# Patient Record
Sex: Male | Born: 1965 | Race: White | Hispanic: No | Marital: Married | Smoking: Never smoker
Health system: Southern US, Community
[De-identification: ages and names within clinical notes are randomized; demographics above are authoritative.]

## PROBLEM LIST (undated history)

## (undated) DIAGNOSIS — K529 Noninfective gastroenteritis and colitis, unspecified: Secondary | ICD-10-CM

## (undated) DIAGNOSIS — N200 Calculus of kidney: Secondary | ICD-10-CM

## (undated) HISTORY — DX: Calculus of kidney: N20.0

## (undated) HISTORY — PX: HERNIA REPAIR: SHX51

## (undated) HISTORY — DX: Noninfective gastroenteritis and colitis, unspecified: K52.9

---

## 2003-12-19 ENCOUNTER — Emergency Department (HOSPITAL_COMMUNITY): Admission: EM | Admit: 2003-12-19 | Discharge: 2003-12-19 | Payer: Self-pay | Admitting: Emergency Medicine

## 2010-09-20 ENCOUNTER — Emergency Department (INDEPENDENT_AMBULATORY_CARE_PROVIDER_SITE_OTHER): Payer: 59

## 2010-09-20 ENCOUNTER — Emergency Department (HOSPITAL_BASED_OUTPATIENT_CLINIC_OR_DEPARTMENT_OTHER)
Admission: EM | Admit: 2010-09-20 | Discharge: 2010-09-20 | Disposition: A | Discharge status: Home / Self Care | Payer: 59 | Attending: Emergency Medicine | Admitting: Emergency Medicine

## 2010-09-20 ENCOUNTER — Other Ambulatory Visit: Payer: Self-pay

## 2010-09-20 DIAGNOSIS — R079 Chest pain, unspecified: Secondary | ICD-10-CM

## 2010-09-20 DIAGNOSIS — R0602 Shortness of breath: Secondary | ICD-10-CM | POA: Insufficient documentation

## 2010-09-20 LAB — CBC
Hemoglobin: 16.3 g/dL (ref 13.0–17.0)
MCH: 30.6 pg (ref 26.0–34.0)
MCHC: 36.4 g/dL — ABNORMAL HIGH (ref 30.0–36.0)
Platelets: 185 10*3/uL (ref 150–400)
RBC: 5.33 MIL/uL (ref 4.22–5.81)
RDW: 11.9 % (ref 11.5–15.5)
WBC: 6.1 10*3/uL (ref 4.0–10.5)

## 2010-09-20 LAB — BASIC METABOLIC PANEL
Calcium: 10.2 mg/dL (ref 8.4–10.5)
Chloride: 103 mEq/L (ref 96–112)
GFR calc Af Amer: >60 mL/min (ref >60)
GFR calc non Af Amer: >60 mL/min (ref >60)
Glucose, Bld: 107 mg/dL — ABNORMAL HIGH (ref 70–99)
Potassium: 3.5 mEq/L (ref 3.5–5.1)
Sodium: 141 mEq/L (ref 135–145)

## 2010-09-20 LAB — HEPATIC FUNCTION PANEL
ALT: 27 U/L (ref 0–53)
AST: 24 U/L (ref 0–37)
Albumin: 4.6 g/dL (ref 3.5–5.2)
Alkaline Phosphatase: 73 U/L (ref 39–117)
Bilirubin, Direct: 0.1 mg/dL (ref 0.0–0.3)
Indirect Bilirubin: 0.4 mg/dL (ref 0.3–0.9)
Total Bilirubin: 0.5 mg/dL (ref 0.3–1.2)

## 2010-09-20 LAB — CARDIAC PANEL(CRET KIN+CKTOT+MB+TROPI)
CK, MB: 1.8 ng/mL (ref 0.3–4.0)
Relative Index: INVALID (ref 0.0–2.5)
Relative Index: INVALID (ref 0.0–2.5)
Total CK: 89 U/L (ref 7–232)
Total CK: 67 U/L (ref 7–232)
Troponin I: <0.3 ng/mL (ref <0.30)
Troponin I: <0.3 ng/mL (ref <0.30)

## 2010-09-20 LAB — LIPASE, BLOOD: Lipase: 71 U/L — ABNORMAL HIGH (ref 11–59)

## 2010-09-20 MED ORDER — ACETAMINOPHEN 325 MG PO TABS
650.0000 mg | ORAL_TABLET | Freq: Once | ORAL | Status: AC
  Administered 2010-09-20: 650 mg via ORAL

## 2010-09-20 MED ORDER — SODIUM CHLORIDE 0.9 % IV BOLUS (SEPSIS)
1000.0000 mL | Freq: Once | INTRAVENOUS | Status: AC
  Administered 2010-09-20: 1000 mL via INTRAVENOUS

## 2010-09-20 MED ORDER — ACETAMINOPHEN 325 MG PO TABS
ORAL_TABLET | ORAL | Status: AC
  Filled 2010-09-20: qty 2

## 2010-09-20 MED ORDER — ASPIRIN 81 MG PO CHEW
162.0000 mg | CHEWABLE_TABLET | Freq: Once | ORAL | Status: AC
  Administered 2010-09-20: 162 mg via ORAL
  Filled 2010-09-20: qty 2

## 2010-09-20 MED ORDER — NITROGLYCERIN 0.4 MG SL SUBL
0.4000 mg | SUBLINGUAL_TABLET | SUBLINGUAL | Status: DC | PRN
  Administered 2010-09-20 (×2): 0.4 mg via SUBLINGUAL
  Filled 2010-09-20: qty 25

## 2010-09-20 NOTE — ED Notes (Signed)
Pt states that he had chest pain about a month ago and took aspirin for it and it went away.  Pt states that this morning chest pain came back about  3hours ago.  Pt took two 81 mg aspirin at home pta.  Neg cardiac hx.  ECG NSR

## 2010-09-20 NOTE — ED Notes (Signed)
Family at bedside. 

## 2010-09-20 NOTE — ED Notes (Signed)
Report received from Maryruth Hancock, RN care assumed.  Dr Bernette Mayers at bedside.  NSR per cardiac monitor.

## 2010-09-20 NOTE — ED Notes (Signed)
Pt is pale, diaphoretic and c/o not feeling well.  BP 876/50 SB with a rate of 44-46.  Dr. Bernette Mayers informed and at bedside.  IVF open to bolus.

## 2010-09-20 NOTE — ED Notes (Signed)
Color improving, skin warm and dry.  IV site #2 initiated.  Pt reports feeling better.  VS improving.

## 2010-09-20 NOTE — ED Provider Notes (Signed)
History     CSN: [id number] Arrival date & time: 09/20/2010  6:26 AM  Chief Complaint  Patient presents with  . Chest Pain  . Shortness of Breath   HPI Pt reports onset of severe sharp midsternal chest pain about 2am. Continued all night, improved some this morning. Associated with fast breathing, no radiation into jaw or arm, no diaphoresis, mild nausea. He has had similar episodes in the past that have resolved after with calming. He states this episode has lasted longer. He still has 5/10 pain now. PERC neg, no PE or CAD risk factors.   History reviewed. No pertinent past medical history.  Past Surgical History  Procedure Date  . Hernia repair     History reviewed. No pertinent family history.  History  Substance Use Topics  . Smoking status: Never Smoker   . Smokeless tobacco: Never Used  . Alcohol Use: 0.0 oz/week    1-2 Cans of beer per week      Review of Systems All other systems reviewed and are negative except as noted in HPI.   Physical Exam  BP 125/86  Pulse 62  Temp(Src) 98.8 F (37.1 C) (Oral)  Resp 23  SpO2 100%  Physical Exam  Nursing note and vitals reviewed. Constitutional: He is oriented to person, place, and time. He appears well-developed and well-nourished.  HENT:  Head: Normocephalic and atraumatic.  Eyes: EOM are normal. Pupils are equal, round, and reactive to light.  Neck: Normal range of motion. Neck supple.  Cardiovascular: Normal rate, normal heart sounds and intact distal pulses.   Pulmonary/Chest: Effort normal and breath sounds normal.  Abdominal: Bowel sounds are normal. He exhibits no distension. There is tenderness (mild epigastric tenderness without guarding).  Musculoskeletal: Normal range of motion. He exhibits no edema and no tenderness.  Neurological: He is alert and oriented to person, place, and time. No cranial nerve deficit.  Skin: Skin is warm and dry. No rash noted.  Psychiatric: He has a normal mood and  affect.    ED Course  Procedures  MDM    Date: 09/20/2010  Rate: 65  Rhythm: normal sinus rhythm  QRS Axis: normal  Intervals: normal  ST/T Wave abnormalities: normal  Conduction Disutrbances:none  Narrative Interpretation:   Old EKG Reviewed: none available   8:18 AM Pt had a brief episode of hypotension and bradycardia following NTG administration. Resolved with IVF. Feeling better now. Awaiting second set of cardiac markers. Pt is low risk for CAD.    10:47 AM PT resting comfortably. Labs and imaging unremarkable except for mildly elevated lipase of unclear significance. His epigastric tenderness is gone. He is a low risk patient for CAD. Had a long discussion with the patient and his wife regarding further evaluation of his pain. Offered ED observation protocol vs outpatient stress test and he would prefer to go home. He will followup with cardiology for outpatient evaluation. Advised to take ASA daily and return immediately for any return of CP, SOB or for any other concerns.   Kalib Bhagat B. Bernette Mayers, MD 09/20/10 1048

## 2010-09-27 ENCOUNTER — Encounter: Payer: Self-pay | Admitting: Internal Medicine

## 2010-10-21 ENCOUNTER — Encounter: Payer: Self-pay | Admitting: Internal Medicine

## 2010-10-22 ENCOUNTER — Encounter: Payer: Self-pay | Admitting: Internal Medicine

## 2010-10-22 ENCOUNTER — Ambulatory Visit (INDEPENDENT_AMBULATORY_CARE_PROVIDER_SITE_OTHER): Payer: Managed Care, Other (non HMO) | Admitting: Internal Medicine

## 2010-10-22 DIAGNOSIS — R079 Chest pain, unspecified: Secondary | ICD-10-CM

## 2010-10-22 DIAGNOSIS — R1011 Right upper quadrant pain: Secondary | ICD-10-CM

## 2010-10-22 MED ORDER — OMEPRAZOLE 20 MG PO CPDR: 20.0000 mg | DELAYED_RELEASE_CAPSULE | Freq: Every day | ORAL | Status: DC

## 2010-10-22 NOTE — Patient Instructions (Signed)
Abdominal ultrasound to be scheduled. 

## 2010-10-22 NOTE — Progress Notes (Signed)
HPIPt reports onset of severe sharp midsternal chest pain about 2am. Continued all night, improved some this morning. Associated with fast breathing, no radiation into jaw or arm, no diaphoresis, mild nausea. He has had similar episodes in the past that have resolved after with calming. He states this episode has lasted longer.  Patient observed, enzymes negative.  Sent out for outpatient evaluation Since he left the patient has had 2 episodes.  Sharp stabbing sensation.  Tightness.  No real activity.   Has walked up steep incline, played golf couple times. Can walk dogs 30 min No problems First speel April  Second in June Father and paternal GF with CAD.     Allergies  Allergen Reactions  . Penicillins Rash    Current Outpatient Prescriptions  Medication Sig Dispense Refill  . aspirin 81 MG tablet Take 162 mg by mouth once.          Past Medical History  Diagnosis Date  . Chest pain     Past Surgical History  Procedure Date  . Hernia repair   . Hernia repair     Family History  Problem Relation Age of Onset  . Coronary artery disease Father 81    CABG    History   Social History  . Marital Status: Married    Spouse Name: N/A    Number of Children: 1  . Years of Education: N/A   Occupational History  .  [employer]   Social History Main Topics  . Smoking status: Never Smoker   . Smokeless tobacco: Never Used  . Alcohol Use: 0.0 oz/week    1-2 Cans of beer per week  . Drug Use: No  . Sexually Active: Yes    Birth Control/ Protection: Condom   Other Topics Concern  . Not on file   Social History Narrative  . No narrative on file    Review of Systems:  All systems reviewed.  They are negative to the above problem except as previously stated.  Vital Signs: BP 114/76  Pulse 79  Ht 5\' 10"  (1.778 m)  Wt 205 lb (92.987 kg)  BMI 29.41 kg/m2  Physical Exam  Patient is in NAD HEENT:  Normocephalic, atraumatic. EOMI, PERRLA.  Neck: JVP is  normal. No thyromegaly. No bruits.  Lungs: clear to auscultation. No rales no wheezes.  Heart: Regular rate and rhythm. Normal S1, S2. No S3.   No significant murmurs. PMI not displaced.  Abdomen:  Supple, nontender. Normal bowel sounds. No masses. No hepatomegaly.  Extremities:   Good distal pulses throughout. No lower extremity edema.  Musculoskeletal :moving all extremities.  Neuro:   alert and oriented x3.  CN II-XII grossly intact. EKG:  Sinus rhythm.  79 bpm.   Assessment and Plan:

## 2010-10-26 DIAGNOSIS — R079 Chest pain, unspecified: Secondary | ICD-10-CM | POA: Insufficient documentation

## 2010-10-26 NOTE — Assessment & Plan Note (Signed)
Pain is very atypical for coronary ischemia unless it is possibly due to coronary spasm.  No mention on ER visit that EKG was abnormal. Of note in the recent ER visit his lipase was elevated.  I wonder if symptoms are triggered by a gallstone.  Again, they are too erratic and not associated with activity to suggest demand ischemia. I would recomm an abdomenal USN to define GB and pancreas.  No change in meds for now.

## 2010-10-28 ENCOUNTER — Ambulatory Visit (HOSPITAL_COMMUNITY)
Admission: RE | Admit: 2010-10-28 | Discharge: 2010-10-28 | Disposition: A | Discharge status: Home / Self Care | Payer: 59 | Source: Ambulatory Visit | Attending: Internal Medicine | Admitting: Internal Medicine

## 2010-10-28 ENCOUNTER — Telehealth: Payer: Self-pay | Admitting: Internal Medicine

## 2010-10-28 DIAGNOSIS — R079 Chest pain, unspecified: Secondary | ICD-10-CM

## 2010-10-28 DIAGNOSIS — K8020 Calculus of gallbladder without cholecystitis without obstruction: Secondary | ICD-10-CM | POA: Insufficient documentation

## 2010-10-28 DIAGNOSIS — R1011 Right upper quadrant pain: Secondary | ICD-10-CM | POA: Insufficient documentation

## 2010-10-28 NOTE — Telephone Encounter (Signed)
Call back from Dr. Tenny Craw. She states she is not convinced the patient is not having GI issues, but since his symptoms are atypical, she would like to have him see GI tomorrow since his episode of pain yesterday lasted an hour. We will also set him up for a cardiac CT. I have spoken with GI and the patient has an appointment tomorrow at 3:30pm with Dr. Russella Dar. I will order the cardiac CT and notify the patient's wife.

## 2010-10-28 NOTE — Telephone Encounter (Signed)
I spoke with the patient's wife. She is aware of the appointment with Dr. Russella Dar for tomorrow at 3:30pm. I have advised them to be there by 3:00pm and to call by 5:00pm today if he cannot make the appointment tomorrow. Jodie verbalizes understanding. I explained I will order the cardiac CT and then our PCC's will call back to set this up. I also explained we will hold off on the PA for omeprazole until the patient sees Dr. Russella Dar tomorrow.

## 2010-10-28 NOTE — Telephone Encounter (Signed)
Pt still has not got his omeprazole 20mg  qd at [facility] on [address] because they need autherizations and pt would like results of testing done

## 2010-10-28 NOTE — Telephone Encounter (Signed)
I spoke with the patient's wife. She states that the patient reports another episode of severe chest pain yesterday that occurred with sitting and lasted about an hour. He denies any radiation of symptoms. He has a history of hernia repair. He had a negative abdominal ultrasound that was just done. He was given a prescription for omeprazole at his office visit, but this has not been filled due to the need for PA. He did start OTC famotidine 20mg  daily and has been taking this on a daily basis since his office visit. He continues with his ASA. I explained to the patient's wife that I would call Dr. Tenny Craw to discuss this with her. 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 She states she can email him, but can't guarantee a timely call back. I will speak with Dr. Tenny Craw and call her back. Sherri Rad, RN, BSN   I reviewed the patient's symptoms with Dr. Tenny Craw. She is undecided as to what the best option would be for the patient (possible cath vs Cardiac CT). She states she will call the patient's wife and then let me know what needs to be done. Sherri Rad, RN, BSN

## 2010-10-29 ENCOUNTER — Ambulatory Visit (INDEPENDENT_AMBULATORY_CARE_PROVIDER_SITE_OTHER): Payer: 59 | Admitting: Gastroenterology

## 2010-10-29 ENCOUNTER — Telehealth: Payer: Self-pay | Admitting: Internal Medicine

## 2010-10-29 ENCOUNTER — Encounter: Payer: Self-pay | Admitting: Gastroenterology

## 2010-10-29 ENCOUNTER — Other Ambulatory Visit (INDEPENDENT_AMBULATORY_CARE_PROVIDER_SITE_OTHER): Payer: 59

## 2010-10-29 DIAGNOSIS — R079 Chest pain, unspecified: Secondary | ICD-10-CM

## 2010-10-29 DIAGNOSIS — K8020 Calculus of gallbladder without cholecystitis without obstruction: Secondary | ICD-10-CM

## 2010-10-29 NOTE — Telephone Encounter (Signed)
Pt talked to [facility] on Thurs about a pres.  Dr.Stark wants him to take it.  The prescription needs to be authorized.  Prilosec generic .  Check insurance card for number to call.  Call wife back when this is done.  Call into [facility] on [address].

## 2010-10-29 NOTE — Progress Notes (Signed)
History of Present Illness: This is a 45 year old male here today with his wife. He has had infrequent episodes of heartburn brought on by certain dietary stressors over the years. He has not noted any significant heartburn symptoms in the past few months. He has had recurrent episodes of lower chest pain that radiated across his anterior chest for several weeks. The symptoms have generally occurred when he is resting. They have not been associated with meals. The chest pain tends to last approximately one hour. One episode was associated with shortness of breath. He was evaluated in the emergency room and all blood work was unremarkable except for minimally elevated lipase at 71, which is likely nonspecific. Abdominal exam performed yesterday showed cholelithiasis without other abnormalities. Denies weight loss, abdominal pain, constipation, diarrhea, change in stool caliber, melena, hematochezia, nausea, vomiting, dysphagia.  Review of Systems: Pertinent positive and negative review of systems were noted in the above HPI section. All other review of systems were otherwise negative.  Current Medications, Allergies, Past Medical History, Past Surgical History, Family History and Social History were reviewed in [employer] record.  Physical Exam: General: Well developed , well nourished, no acute distress Head: Normocephalic and atraumatic Eyes:  sclerae anicteric, EOMI Ears: Normal auditory acuity Mouth: No deformity or lesions Neck: Supple, no masses or thyromegaly Lungs: Clear throughout to auscultation, no chest wall tenderness Heart: Regular rate and rhythm; no murmurs, rubs or bruits Abdomen: Soft, non tender and non distended. No masses, hepatosplenomegaly or hernias noted. Normal Bowel sounds Musculoskeletal: Symmetrical with no gross deformities  Skin: No lesions on visible extremities Pulses:  Normal pulses noted Extremities: No clubbing, cyanosis, edema or  deformities noted Neurological: Alert oriented x 4, grossly nonfocal Cervical Nodes:  No significant cervical adenopathy Inguinal Nodes: No significant inguinal adenopathy Psychological:  Alert and cooperative. Normal mood and affect  Assessment and Recommendations:  1. Chest pain. R/O GERD, biliary and non GI causes. He has occasional reflux symptoms but his episodes of chest pain are atypical for GERD. He is advised to increase famotidine 20 mg to twice daily or discontinue famotidine and begin omeprazole 20 mg daily. Follow standard antireflux measures. Further evaluation with upper endoscopy. Evaluation for non GI etiologies per Dr. Tenny Craw.  2. Cholelithiasis. Possibly leading to chest pain however his symptoms are atypical. If no other cause is found and his symptoms persist will proceed with a HIDA scan or surgical consultation.  3. Minimally elevated lipase. Likely nonspecific. Repeat lipase.

## 2010-10-29 NOTE — Patient Instructions (Addendum)
Go directly to the basement to have your labs drawn today. Continue taking famotidine one tablet by mouth twice daily or switch to omeprazole one tablet by mouth once daily. You have been scheduled for a Upper Endoscopy. See separate instructions.  cc: Dietrich Pates, MD

## 2010-11-01 ENCOUNTER — Other Ambulatory Visit: Payer: Self-pay | Admitting: *Deleted

## 2010-11-01 DIAGNOSIS — R0789 Other chest pain: Secondary | ICD-10-CM

## 2010-11-01 NOTE — Telephone Encounter (Signed)
Called [phone] and provided the pt's ID # of [phone]. They will fax a form for MD to complete for Omeprazole 20mg   Case ID # is [id number].

## 2010-11-05 NOTE — Telephone Encounter (Signed)
Called Jodie (pt's wife) and advised that [place] health care would not cover a Cardiac CT scan so Dr.Ross ordered a stress echo. She also knows about the difficulty getting coverage for Prilosec. She might get him Prilosec OTC.

## 2010-11-09 ENCOUNTER — Telehealth: Payer: Self-pay | Admitting: Internal Medicine

## 2010-11-09 NOTE — Telephone Encounter (Signed)
Left messages on 11/05/10 and 11/09/10 for patient to call and scheduled appointment.

## 2010-11-26 ENCOUNTER — Other Ambulatory Visit: Payer: 59 | Admitting: Gastroenterology

## 2010-12-07 ENCOUNTER — Telehealth: Payer: Self-pay | Admitting: Internal Medicine

## 2010-12-07 NOTE — Telephone Encounter (Signed)
New problem:  Per Jodie- will be faxing over a Wellness form- additional question.

## 2010-12-09 ENCOUNTER — Other Ambulatory Visit (HOSPITAL_COMMUNITY): Payer: Self-pay | Admitting: Internal Medicine

## 2010-12-09 DIAGNOSIS — I509 Heart failure, unspecified: Secondary | ICD-10-CM

## 2010-12-10 ENCOUNTER — Ambulatory Visit (HOSPITAL_COMMUNITY): Payer: 59 | Attending: Cardiology | Admitting: Radiology

## 2010-12-16 NOTE — Telephone Encounter (Signed)
Form completed and faxed to [phone].

## 2010-12-18 ENCOUNTER — Encounter (HOSPITAL_BASED_OUTPATIENT_CLINIC_OR_DEPARTMENT_OTHER): Payer: Self-pay | Admitting: Emergency Medicine

## 2010-12-18 ENCOUNTER — Emergency Department (HOSPITAL_BASED_OUTPATIENT_CLINIC_OR_DEPARTMENT_OTHER)
Admission: EM | Admit: 2010-12-18 | Discharge: 2010-12-18 | Disposition: A | Discharge status: Home / Self Care | Payer: 59 | Attending: Emergency Medicine | Admitting: Emergency Medicine

## 2010-12-18 ENCOUNTER — Emergency Department (INDEPENDENT_AMBULATORY_CARE_PROVIDER_SITE_OTHER): Payer: 59

## 2010-12-18 DIAGNOSIS — K529 Noninfective gastroenteritis and colitis, unspecified: Secondary | ICD-10-CM

## 2010-12-18 DIAGNOSIS — R1110 Vomiting, unspecified: Secondary | ICD-10-CM

## 2010-12-18 DIAGNOSIS — R109 Unspecified abdominal pain: Secondary | ICD-10-CM

## 2010-12-18 DIAGNOSIS — K8020 Calculus of gallbladder without cholecystitis without obstruction: Secondary | ICD-10-CM

## 2010-12-18 DIAGNOSIS — K5289 Other specified noninfective gastroenteritis and colitis: Secondary | ICD-10-CM | POA: Insufficient documentation

## 2010-12-18 LAB — URINALYSIS, ROUTINE W REFLEX MICROSCOPIC
Glucose, UA: NEGATIVE mg/dL
Hgb urine dipstick: NEGATIVE
Ketones, ur: 15 mg/dL — AB
Leukocytes, UA: NEGATIVE
Nitrite: NEGATIVE
Protein, ur: 100 mg/dL — AB
Specific Gravity, Urine: 1.035 — ABNORMAL HIGH (ref 1.005–1.030)
Urobilinogen, UA: 1 mg/dL (ref 0.0–1.0)
pH: 5.5 (ref 5.0–8.0)

## 2010-12-18 LAB — COMPREHENSIVE METABOLIC PANEL
ALT: 24 U/L (ref 0–53)
AST: 18 U/L (ref 0–37)
Albumin: 4.3 g/dL (ref 3.5–5.2)
Alkaline Phosphatase: 63 U/L (ref 39–117)
BUN: 20 mg/dL (ref 6–23)
CO2: 25 mEq/L (ref 19–32)
Calcium: 9.4 mg/dL (ref 8.4–10.5)
Chloride: 104 mEq/L (ref 96–112)
Creatinine, Ser: 1.1 mg/dL (ref 0.50–1.35)
GFR calc Af Amer: >90 mL/min (ref >90)
GFR calc non Af Amer: 79 mL/min — ABNORMAL LOW (ref >90)
Glucose, Bld: 115 mg/dL — ABNORMAL HIGH (ref 70–99)
Potassium: 3.9 mEq/L (ref 3.5–5.1)
Sodium: 139 mEq/L (ref 135–145)
Total Bilirubin: 0.4 mg/dL (ref 0.3–1.2)
Total Protein: 6.9 g/dL (ref 6.0–8.3)

## 2010-12-18 LAB — URINE MICROSCOPIC-ADD ON

## 2010-12-18 LAB — CBC
HCT: 41.6 % (ref 39.0–52.0)
Hemoglobin: 15 g/dL (ref 13.0–17.0)
MCH: 30.4 pg (ref 26.0–34.0)
MCHC: 36.1 g/dL — ABNORMAL HIGH (ref 30.0–36.0)
MCV: 84.2 fL (ref 78.0–100.0)
Platelets: 168 10*3/uL (ref 150–400)
RBC: 4.94 MIL/uL (ref 4.22–5.81)
RDW: 11.9 % (ref 11.5–15.5)
WBC: 6 10*3/uL (ref 4.0–10.5)

## 2010-12-18 LAB — LIPASE, BLOOD: Lipase: 68 U/L — ABNORMAL HIGH (ref 11–59)

## 2010-12-18 MED ORDER — HYDROMORPHONE HCL PF 1 MG/ML IJ SOLN
1.0000 mg | Freq: Once | INTRAMUSCULAR | Status: AC
  Administered 2010-12-18: 1 mg via INTRAVENOUS
  Filled 2010-12-18: qty 1

## 2010-12-18 MED ORDER — ONDANSETRON HCL 4 MG PO TABS: 4.0000 mg | ORAL_TABLET | Freq: Four times a day (QID) | ORAL | Status: AC

## 2010-12-18 MED ORDER — METRONIDAZOLE 500 MG PO TABS: 500.0000 mg | ORAL_TABLET | Freq: Two times a day (BID) | ORAL | Status: AC

## 2010-12-18 MED ORDER — HYDROCODONE-ACETAMINOPHEN 5-325 MG PO TABS: 1.0000 | ORAL_TABLET | ORAL | Status: AC | PRN

## 2010-12-18 MED ORDER — SODIUM CHLORIDE 0.9 % IV BOLUS (SEPSIS)
1000.0000 mL | Freq: Once | INTRAVENOUS | Status: AC
  Administered 2010-12-18: 1000 mL via INTRAVENOUS

## 2010-12-18 MED ORDER — CIPROFLOXACIN HCL 500 MG PO TABS: 500.0000 mg | ORAL_TABLET | Freq: Two times a day (BID) | ORAL | Status: AC

## 2010-12-18 MED ORDER — KETOROLAC TROMETHAMINE 15 MG/ML IJ SOLN
15.0000 mg | Freq: Once | INTRAMUSCULAR | Status: AC
  Administered 2010-12-18: 15 mg via INTRAVENOUS
  Filled 2010-12-18: qty 1

## 2010-12-18 MED ORDER — METRONIDAZOLE 500 MG PO TABS
500.0000 mg | ORAL_TABLET | Freq: Once | ORAL | Status: AC
  Administered 2010-12-18: 500 mg via ORAL
  Filled 2010-12-18: qty 1

## 2010-12-18 MED ORDER — ONDANSETRON HCL 4 MG/2ML IJ SOLN
4.0000 mg | Freq: Once | INTRAMUSCULAR | Status: AC
  Administered 2010-12-18: 4 mg via INTRAVENOUS
  Filled 2010-12-18: qty 2

## 2010-12-18 MED ORDER — CIPROFLOXACIN HCL 500 MG PO TABS
500.0000 mg | ORAL_TABLET | Freq: Once | ORAL | Status: AC
  Administered 2010-12-18: 500 mg via ORAL
  Filled 2010-12-18: qty 1

## 2010-12-18 NOTE — ED Notes (Signed)
Pt reports Hx of Kidney stones in the past

## 2010-12-18 NOTE — ED Notes (Signed)
MD at bedside. 

## 2010-12-18 NOTE — ED Notes (Signed)
Pt presented to ED today without indwelling IV catheters.  IVs not charted as removed from previous encounters

## 2010-12-18 NOTE — ED Provider Notes (Signed)
History    45yM with abdominal pain. R sided. Acute onset this morning around 0230 while sleeping. Sharp. Hurts in R flank and back. No appreciable exacerbating or relieving factors. Nausea. Denies vomiting. No diarrhea. No fever or chills. Denies hx of abdominal surgery. Past hx of kidney stones 7y ago and thinks feels similar. No urinary complaints. Denies significant etoh ingestion. Felt fine when went to bed.   CSN: [id number] Arrival date & time: 12/18/2010  6:02 AM   First MD Initiated Contact with Patient 12/18/10 [phone]      Chief Complaint  Patient presents with  . Flank Pain    flank pain with abd pain nausea and vomiting    (Consider location/radiation/quality/duration/timing/severity/associated sxs/prior treatment) HPI  Past Medical History  Diagnosis Date  . Chest pain   . Kidney stone     Hx of 12/05    Past Surgical History  Procedure Date  . Hernia repair     Family History  Problem Relation Age of Onset  . Coronary artery disease Father 89    CABG    History  Substance Use Topics  . Smoking status: Never Smoker   . Smokeless tobacco: Never Used  . Alcohol Use: 0.0 oz/week    1-2 Cans of beer per week      Review of Systems   Review of symptoms negative unless otherwise noted in HPI.   Allergies  Penicillins  Home Medications   Current Outpatient Rx  Name Route Sig Dispense Refill  . ASPIRIN 81 MG PO TABS Oral Take 162 mg by mouth once.      Marland Kitchen FAMOTIDINE 20 MG PO TABS Oral Take 20 mg by mouth daily.      Marland Kitchen OMEPRAZOLE 20 MG PO CPDR Oral Take 1 capsule (20 mg total) by mouth daily. 30 capsule 1    BP 139/79  Pulse 55  Temp 97.9 F (36.6 C)  Resp 22  Wt 205 lb (92.987 kg)  SpO2 99%  Physical Exam  Nursing note and vitals reviewed. Constitutional: He appears well-developed and well-nourished.       appears mildly uncomfortable.  HENT:  Head: Normocephalic and atraumatic.  Eyes: Conjunctivae are normal. Right eye exhibits no  discharge. Left eye exhibits no discharge.  Neck: Neck supple.  Cardiovascular: Normal rate, regular rhythm and normal heart sounds.  Exam reveals no gallop and no friction rub.   No murmur heard. Pulmonary/Chest: Effort normal and breath sounds normal. No respiratory distress.  Abdominal: Soft. He exhibits no distension and no mass. There is tenderness. There is guarding. There is no rebound.       Moderate tenderness in RUQ and R flank. Voluntary guarding. No rebound. Small well healed horizontal incision r abdominal wall.  Genitourinary:       No cva tenderness  Musculoskeletal: He exhibits no edema and no tenderness.  Neurological: He is alert.  Skin: Skin is warm and dry. He is not diaphoretic.  Psychiatric: He has a normal mood and affect. His behavior is normal. Thought content normal.    ED Course  Procedures (including critical care time)  Labs Reviewed  URINALYSIS, ROUTINE W REFLEX MICROSCOPIC - Abnormal; Notable for the following:    Color, Urine AMBER (*) BIOCHEMICALS MAY BE AFFECTED BY COLOR   APPearance CLOUDY (*)    Specific Gravity, Urine 1.035 (*)    Bilirubin Urine SMALL (*)    Ketones, ur 15 (*)    Protein, ur 100 (*)    All other  components within normal limits  URINE MICROSCOPIC-ADD ON - Abnormal; Notable for the following:    Crystals CA OXALATE CRYSTALS (*)    All other components within normal limits  COMPREHENSIVE METABOLIC PANEL  CBC   No results found.   1. Colitis       MDM  45yM with R sided abdominal pain. Consider renal colic given nature of symptoms and prior hx. No blood on UA though. Also consider biliary colic. Pt just had RUQ US done 10/28/10 which showed multiple small gallstones, largest measuring 0.2cm. Less likely appy, pancreatitis, r sided diverticulitis. Doubt atypical ACS. Pt also had stress ECHO 1w ago 12/10/10 which was normal.  UA not suggestive of infection. Plan basic labs and CT. Pain meds. Pt with recent w/u of  epigastric/lower sternal pain which did not yield clear etiology. Possibly gallbladder. Do not feel repeat US needed at this time if CT not suggestive. Doubt cholecystitis with such abrupt onset, afebrile, no leukocytosis and can f/u non-emergently with surgery as outpt.       Raeford Razor, MD 12/22/10 [phone]

## 2010-12-18 NOTE — ED Provider Notes (Signed)
Patient with findings of colitis on his CAT scan.  He has a normal white count and normal vital signs at this point in time.  Patient as comfortable as well.  He is tolerating by mouth intake and I feel is safe for discharge home with a prescription for Cipro and Flagyl for antibiotics.  He also be given pain medicines and nausea medicines be used as needed.  He understands to return for worsening pain, fevers, vomiting or other concerns.  Nat Christen, MD 12/18/10 [phone]

## 2012-03-26 ENCOUNTER — Other Ambulatory Visit (INDEPENDENT_AMBULATORY_CARE_PROVIDER_SITE_OTHER): Payer: Managed Care, Other (non HMO)

## 2012-03-26 ENCOUNTER — Ambulatory Visit (INDEPENDENT_AMBULATORY_CARE_PROVIDER_SITE_OTHER): Payer: Managed Care, Other (non HMO) | Admitting: Physician Assistant

## 2012-03-26 ENCOUNTER — Telehealth: Payer: Self-pay | Admitting: Gastroenterology

## 2012-03-26 ENCOUNTER — Encounter: Payer: Self-pay | Admitting: Physician Assistant

## 2012-03-26 VITALS — BP 122/76 | HR 88 | Ht 68.0 in | Wt 197.5 lb

## 2012-03-26 DIAGNOSIS — R109 Unspecified abdominal pain: Secondary | ICD-10-CM

## 2012-03-26 DIAGNOSIS — N201 Calculus of ureter: Secondary | ICD-10-CM

## 2012-03-26 DIAGNOSIS — K8020 Calculus of gallbladder without cholecystitis without obstruction: Secondary | ICD-10-CM

## 2012-03-26 DIAGNOSIS — R634 Abnormal weight loss: Secondary | ICD-10-CM

## 2012-03-26 DIAGNOSIS — R110 Nausea: Secondary | ICD-10-CM

## 2012-03-26 DIAGNOSIS — R194 Change in bowel habit: Secondary | ICD-10-CM

## 2012-03-26 DIAGNOSIS — R935 Abnormal findings on diagnostic imaging of other abdominal regions, including retroperitoneum: Secondary | ICD-10-CM

## 2012-03-26 DIAGNOSIS — R198 Other specified symptoms and signs involving the digestive system and abdomen: Secondary | ICD-10-CM

## 2012-03-26 LAB — CBC WITH DIFFERENTIAL/PLATELET
Basophils Absolute: 0.1 10*3/uL (ref 0.0–0.1)
HCT: 45.3 % (ref 39.0–52.0)
Hemoglobin: 15.7 g/dL (ref 13.0–17.0)
Lymphs Abs: 1.1 10*3/uL (ref 0.7–4.0)
MCV: 88.2 fl (ref 78.0–100.0)
Monocytes Relative: 9.7 % (ref 3.0–12.0)
Neutro Abs: 5.9 10*3/uL (ref 1.4–7.7)
RDW: 12.3 % (ref 11.5–14.6)

## 2012-03-26 LAB — COMPREHENSIVE METABOLIC PANEL
BUN: 21 mg/dL (ref 6–23)
CO2: 27 mEq/L (ref 19–32)
Calcium: 9.4 mg/dL (ref 8.4–10.5)
Chloride: 100 mEq/L (ref 96–112)
Creatinine, Ser: 1.2 mg/dL (ref 0.4–1.5)
GFR: 72.32 mL/min (ref >60.00)

## 2012-03-26 MED ORDER — MOVIPREP 100 G PO SOLR: 1.0000 | Freq: Once | ORAL | Status: DC

## 2012-03-26 MED ORDER — DICYCLOMINE HCL 10 MG PO CAPS: 10.0000 mg | ORAL_CAPSULE | Freq: Three times a day (TID) | ORAL | Status: DC

## 2012-03-26 MED ORDER — ONDANSETRON 4 MG PO TBDP: 4.0000 mg | ORAL_TABLET | Freq: Three times a day (TID) | ORAL | Status: DC | PRN

## 2012-03-26 NOTE — Progress Notes (Signed)
Subjective:    Patient ID: Tim Russell, male    DOB: [birth year], 47 y.o.   MRN: [id number]  HPI Tim Russell is a 47 year old white male known to Dr. Russella Dar who had been seen in 2012 for complaints of chest pain which were felt likely to be GERD related. He had an ER visit after that time for abdominal pain and diarrhea and had CT scan of the abdomen and pelvis done through the emergency room in December of 2012 which showed evidence of cholelithiasis. But also showed a diffuse colitis involving the ascending transverse and descending colon as well as mild thickening of the terminal ileum. Patient was treated empirically with a course of Cipro and Flagyl and says that as far as he remembers he did feel that better after that. He says ever since then he has had intermittent episodes of upper abdominal pain which would occur once a month or so and are generally associated with more frequent stools over a several hour period. He says since January of 2014 he has been having more frequent episodes of pain again always associated with a change in his bowel habits with more frequent soft stools but no melena or hematochezia. He says he may have 45 bowel movements in a day. These episodes are at  times associated with nausea and dry heaves. Now  the episodes are coming closer together and the symptoms are lasting longer- sometimes up to 5 days. He also says his appetite has been somewhat decreased over the past several months and has lost 10-15 pounds over the past 6 months. It is not using any regular aspirin or NSAIDs. Review of imaging shows that he did have an abdominal ultrasound also done in 2012 which showed multiple small gallstones but no evidence of wall thickening or ductal dilation.    Review of Systems  Constitutional: Positive for appetite change and unexpected weight change.  HENT: Negative.   Eyes: Negative.   Respiratory: Negative.   Cardiovascular: Negative.   Gastrointestinal: Positive  for nausea, abdominal pain and diarrhea.  Endocrine: Negative.   Genitourinary: Negative.   Allergic/Immunologic: Negative.   Neurological: Negative.   Hematological: Negative.   Psychiatric/Behavioral: Negative.    Outpatient Prescriptions Prior to Visit  Medication Sig Dispense Refill  . aspirin 81 MG tablet Take 162 mg by mouth once.        . famotidine (PEPCID) 20 MG tablet Take 20 mg by mouth daily.        Marland Kitchen omeprazole (PRILOSEC) 20 MG capsule Take 1 capsule (20 mg total) by mouth daily.  30 capsule  1   No facility-administered medications prior to visit.   Allergies  Allergen Reactions  . Penicillins Rash       Patient Active Problem List  Diagnosis  . Chest pain  . Ureterolithiasis  . Cholelithiasis   History  Substance Use Topics  . Smoking status: Never Smoker   . Smokeless tobacco: Never Used  . Alcohol Use: 0.0 oz/week    1-2 Cans of beer per week   family history includes Coronary artery disease (age of onset: 66) in his father.  Objective:   Physical Exam well-developed middle-aged white male in no acute distress blood pressure 122/76 pulse 88 height 5 foot 8 weight 197. HEENT; nontraumatic normocephalic EOMI PERRLA sclera anicteric, Neck; supple no JVD, Cardiovascular; regular rate and rhythm with S1-S2 no murmur or gallop, Pulmonary; clear bilaterally, Abdomen; soft only tender in the hypogastrium there is no guarding or rebound no palpable  mass or hepatosplenomegaly bowel sounds are active on Recta;l exam not done, Extremities; no clubbing cyanosis or edema skin warm and dry, Psych; mood and affect normal  and appropriate        Assessment & Plan:  #59 47 year old male at least one year of intermittent upper abdominal pain associated with frequent bowel movements occurring somewhat episodically. Symptoms have been progressing over the past couple of months with more frequent episodes and more prolonged episodes each associated with pain and nausea and  increased bowel frequency but no real diarrhea or hematochezia. Symptoms have been associated with a gradual weight loss of 10-15 pounds. Patient has had previously documented cholelithiasis and also had evidence of a fairly diffuse colitis on CT scan in December of 2012 unclear whether this was an infectious etiology versus underlying IBD  Plan; check CBC with differential today, CRP, CMET Schedule for upper abdominal ultrasound Schedule for colonoscopy with Dr. Jalene Mullet was discussed in detail with the patient and he is agreeable to proceed  Will give him a trial of Zofran 4 mg every 6 hours as needed for episodes of nausea Garg Bentyl 10 mg by mouth up to 3 times daily as needed for abdominal pain and spasm

## 2012-03-26 NOTE — Telephone Encounter (Signed)
Patient with a history of colitis on CT in 2012.  He reports that his symptoms resolved and he has not had any issues until the last few weeks.  He has had several days of abdominal pain in the mid abdomen.  He denies fever, diarrhea, or other GI complaints.  He will come in and see Mike Gip PA today at 2:30

## 2012-03-26 NOTE — Patient Instructions (Addendum)
Please go to the basement level to have your labs drawn.  We scheduled the Ultrasound at [facility], 1st floor on Thursday 3-20. Arrive at 7:15 Am.  Have nothing to eat or drink after midnight. We sent the prescriptions for Bentyl, Zofran for nausea and the colonoscopy prep to [facility] [address]   You have been scheduled for a colonoscopy with propofol. Please follow written instructions given to you at your visit today.  Please pick up your prep kit at the pharmacy within the next 1-3 days. If you use inhalers (even only as needed), please bring them with you on the day of your procedure.

## 2012-03-27 ENCOUNTER — Encounter: Payer: Self-pay | Admitting: Gastroenterology

## 2012-03-29 ENCOUNTER — Other Ambulatory Visit: Payer: Self-pay | Admitting: *Deleted

## 2012-03-29 ENCOUNTER — Ambulatory Visit (HOSPITAL_COMMUNITY)
Admission: RE | Admit: 2012-03-29 | Discharge: 2012-03-29 | Disposition: A | Discharge status: Home / Self Care | Payer: Managed Care, Other (non HMO) | Source: Ambulatory Visit | Attending: Physician Assistant | Admitting: Physician Assistant

## 2012-03-29 ENCOUNTER — Encounter: Payer: Self-pay | Admitting: Internal Medicine

## 2012-03-29 DIAGNOSIS — K8020 Calculus of gallbladder without cholecystitis without obstruction: Secondary | ICD-10-CM

## 2012-03-29 DIAGNOSIS — R935 Abnormal findings on diagnostic imaging of other abdominal regions, including retroperitoneum: Secondary | ICD-10-CM

## 2012-03-29 DIAGNOSIS — R109 Unspecified abdominal pain: Secondary | ICD-10-CM

## 2012-03-29 DIAGNOSIS — R110 Nausea: Secondary | ICD-10-CM

## 2012-03-29 DIAGNOSIS — R112 Nausea with vomiting, unspecified: Secondary | ICD-10-CM | POA: Insufficient documentation

## 2012-03-29 DIAGNOSIS — R634 Abnormal weight loss: Secondary | ICD-10-CM

## 2012-03-29 DIAGNOSIS — R194 Change in bowel habit: Secondary | ICD-10-CM

## 2012-03-29 NOTE — Telephone Encounter (Deleted)
Error

## 2012-03-30 ENCOUNTER — Encounter (INDEPENDENT_AMBULATORY_CARE_PROVIDER_SITE_OTHER): Payer: Self-pay | Admitting: General Surgery

## 2012-03-30 ENCOUNTER — Ambulatory Visit (INDEPENDENT_AMBULATORY_CARE_PROVIDER_SITE_OTHER): Payer: Managed Care, Other (non HMO) | Admitting: General Surgery

## 2012-03-30 VITALS — BP 138/86 | HR 66 | Temp 98.0°F | Resp 18 | Ht 68.0 in | Wt 190.0 lb

## 2012-03-30 DIAGNOSIS — K8010 Calculus of gallbladder with chronic cholecystitis without obstruction: Secondary | ICD-10-CM

## 2012-03-30 NOTE — Progress Notes (Signed)
Subjective:   recurrent abdominal pain  Patient ID: Tim Russell, male   DOB: [birth year], 47 y.o.   MRN: [id number]  HPI Patient is a pleasant 47 year old male referred by Dr. Russella Russell and Tim Gip PA. He has been over one year history of episodic abdominal pain. This has gradually increased in frequency and duration. He was seen in the emergency room in December of 2012 acute upper abdominal pain but also was found to have diffuse thickening of his colon consistent with colitis and was treated with antibiotics and improved. He however has continued to have abdominal pain which now is occurring several times a week. This usually occurs at night. He describes fairly sudden onset of aching epigastric and low chest pain radiating somewhat to the right side. This is associated with nausea and vomiting. He will often have several bowel movements as well but his bowel habits are normal between episodes of pain. He was found to have gallstones in December 2012 but these were felt to be likely asymptomatic. Abdominal ultrasound was repeated yesterday. He has no fever chills or jaundice. He has no pain or GI complaints in the office today  Past Medical History  Diagnosis Date  . Chest pain   . Kidney stone     Hx of 12/05  . Colitis    Past Surgical History  Procedure Laterality Date  . Hernia repair     Current Outpatient Prescriptions  Medication Sig Dispense Refill  . dicyclomine (BENTYL) 10 MG capsule Take 1 capsule (10 mg total) by mouth 4 (four) times daily -  before meals and at bedtime.  90 capsule  0  . MOVIPREP 100 G SOLR Take 1 kit (100 g total) by mouth once. "Pharmacist please use BIN: [id number] GROUP: [id number] ID: [id number] Call [phone] for pharmacy questions "Pt will save $10"  1 kit  0  . ondansetron (ZOFRAN ODT) 4 MG disintegrating tablet Take 1 tablet (4 mg total) by mouth every 8 (eight) hours as needed for nausea.  30 tablet  0   No current facility-administered  medications for this visit.   Allergies  Allergen Reactions  . Penicillins Rash   History  Substance Use Topics  . Smoking status: Never Smoker   . Smokeless tobacco: Never Used  . Alcohol Use: 0.0 oz/week    1-2 Cans of beer per week     Review of Systems  Constitutional: Positive for unexpected weight change. Negative for fever and chills.  Respiratory: Negative.   Cardiovascular: Negative.   Gastrointestinal: Positive for nausea and abdominal pain.  Genitourinary: Negative.        Objective:   Physical Exam BP 138/86  Pulse 66  Temp(Src) 98 F (36.7 C)  Resp 18  Ht 5\' 8"  (1.727 m)  Wt 190 lb (86.183 kg)  BMI 28.9 kg/m2 General: Alert, well-developed Caucasian male, in no distress Skin: Warm and dry without rash or infection. HEENT: No palpable masses or thyromegaly. Sclera nonicteric. Pupils equal round and reactive. Oropharynx clear. Lymph nodes: No cervical, supraclavicular, or inguinal nodes palpable. Lungs: Breath sounds clear and equal without increased work of breathing Cardiovascular: Regular rate and rhythm without murmur. No JVD or edema. Peripheral pulses intact. Abdomen: Nondistended. Soft and nontender. No masses palpable. No organomegaly. No palpable hernias. Extremities: No edema or joint swelling or deformity. No chronic venous stasis changes. Neurologic: Alert and fully oriented. Gait normal.  COMPLETE ABDOMINAL ULTRASOUND  Comparison: 12/18/2010 CT. 10/28/2010 ultrasound.  Findings:  Gallbladder: Gallstones with a  1.2 cm gallstone in the region of  the gallbladder neck. Gallbladder wall thickening measuring up to  5 mm. No pericholecystic fluid is noted. According to the  ultrasound technologist, the patient was not tender over this  region during scanning however, in the proper clinical setting,  cholecystitis not excluded. Clinical and laboratory correlation  recommended.  Common bile duct: 3.9 mm.  Liver: No focal lesion identified. Within  normal limits in  parenchymal echogenicity.  IVC: Limited evaluation secondary to bowel gas.  Pancreas: Not visualized secondary to bowel gas.  Spleen: 6.6 cm. No focal mass.  Right Kidney: 11.3 cm. No hydronephrosis or renal mass.  Left Kidney: 10.3 cm. No hydronephrosis or renal mass.  Abdominal aorta: Proximal aspect not visualized secondary to bowel  gas. Maximal transverse dimension obtained 1.9 cm. Mild  atherosclerotic type changes.  IMPRESSION:  Gallstones with a 1.2 cm gallstone in the region of the gallbladder  neck. Gallbladder wall thickening measuring up to 5 mm. No  pericholecystic fluid is noted. According to the ultrasound  technologist, the patient was not tender over this region during  scanning however, in the proper clinical setting, cholecystitis not  excluded. Clinical and laboratory correlation recommended.  Inferior vena cava, pancreas and proximal abdominal aorta not  visualized secondary to bowel gas.     Assessment:     Episodic lower chest and epigastric and right-sided abdominal pain entirely consistent with recurrent and worsening biliary colic.  Abdominal ultrasound confirms gallstones and some thickening of the gallbladder wall but he does not have ongoing pain or tenderness to suggest acute cholecystitis. I recommend proceeding with laparoscopic cholecystectomy with cholangiogram.  I discussed the procedure in detail.  The patient was given Agricultural engineer.  We discussed the risks and benefits of a laparoscopic cholecystectomy and possible cholangiogram including, but not limited to bleeding, infection, injury to surrounding structures such as the intestine or liver, bile leak, retained gallstones, need to convert to an open procedure, prolonged diarrhea, blood clots such as  DVT, common bile duct injury, anesthesia risks, and possible need for additional procedures.  The likelihood of improvement in symptoms and return to the patient's normal status is  good. We discussed the typical post-operative recovery course.    Plan:     laparoscopic cholecystectomy with cholangiogram as an outpatient under general anesthesia

## 2012-03-31 NOTE — Progress Notes (Signed)
Reviewed and agree with management plan.  Malcolm T. Stark, MD FACG 

## 2012-04-02 ENCOUNTER — Other Ambulatory Visit (INDEPENDENT_AMBULATORY_CARE_PROVIDER_SITE_OTHER): Payer: Self-pay | Admitting: General Surgery

## 2012-04-02 DIAGNOSIS — K8010 Calculus of gallbladder with chronic cholecystitis without obstruction: Secondary | ICD-10-CM

## 2012-04-02 HISTORY — PX: LAPAROSCOPIC CHOLECYSTECTOMY: SUR755

## 2012-04-12 ENCOUNTER — Encounter (INDEPENDENT_AMBULATORY_CARE_PROVIDER_SITE_OTHER): Payer: Self-pay

## 2012-04-24 ENCOUNTER — Encounter: Payer: Managed Care, Other (non HMO) | Admitting: Gastroenterology

## 2012-04-25 ENCOUNTER — Encounter (INDEPENDENT_AMBULATORY_CARE_PROVIDER_SITE_OTHER): Payer: Self-pay

## 2012-05-04 ENCOUNTER — Ambulatory Visit (INDEPENDENT_AMBULATORY_CARE_PROVIDER_SITE_OTHER): Payer: Managed Care, Other (non HMO) | Admitting: General Surgery

## 2012-05-04 ENCOUNTER — Encounter (INDEPENDENT_AMBULATORY_CARE_PROVIDER_SITE_OTHER): Payer: Self-pay | Admitting: General Surgery

## 2012-05-04 VITALS — BP 120/68 | HR 72 | Resp 14 | Ht 68.0 in | Wt 192.0 lb

## 2012-05-04 DIAGNOSIS — Z09 Encounter for follow-up examination after completed treatment for conditions other than malignant neoplasm: Secondary | ICD-10-CM

## 2012-05-04 NOTE — Progress Notes (Signed)
History: Patient returns 1 month following his laparoscopic cholecystectomy for chronic cholecystitis and recurrent abdominal pain. He is her happy with how he feels. His pain has been completely relieved.  Exam: BP 120/68  Pulse 72  Resp 14  Ht 5\' 8"  (1.727 m)  Wt 192 lb (87.091 kg)  BMI 29.2 kg/m2 General: Appears well Abdomen: Soft and nontender. Wounds are well healed.  Pathology confirmed chronic cholecystitis and cholelithiasis  Assessment and plan: Doing well following a laparoscopic cholecystectomy with relief of her symptoms and no complications. He was discharged to return as needed.

## 2012-11-15 ENCOUNTER — Other Ambulatory Visit: Payer: Self-pay

## 2013-02-27 ENCOUNTER — Encounter: Payer: Self-pay | Admitting: Internal Medicine

## 2014-11-26 ENCOUNTER — Other Ambulatory Visit: Payer: Self-pay | Admitting: Otolaryngology

## 2014-11-26 DIAGNOSIS — R51 Headache: Secondary | ICD-10-CM

## 2014-11-26 DIAGNOSIS — R519 Headache, unspecified: Secondary | ICD-10-CM

## 2014-11-26 DIAGNOSIS — R0981 Nasal congestion: Secondary | ICD-10-CM

## 2014-12-08 ENCOUNTER — Ambulatory Visit
Admission: RE | Admit: 2014-12-08 | Discharge: 2014-12-08 | Disposition: A | Discharge status: Home / Self Care | Payer: Managed Care, Other (non HMO) | Source: Ambulatory Visit | Attending: Otolaryngology | Admitting: Otolaryngology

## 2014-12-08 DIAGNOSIS — R0981 Nasal congestion: Secondary | ICD-10-CM

## 2014-12-08 DIAGNOSIS — R51 Headache: Secondary | ICD-10-CM

## 2014-12-08 DIAGNOSIS — R519 Headache, unspecified: Secondary | ICD-10-CM

## 2014-12-22 ENCOUNTER — Other Ambulatory Visit: Payer: Self-pay | Admitting: Otolaryngology

## 2016-02-29 IMAGING — CT CT MAXILLOFACIAL W/O CM
1 series · 15 of 30 positions shown, 19 images · non-contrast
Comparison: None.

CLINICAL DATA: Left-sided sinus fullness, headaches, and nasal
congestion.

EXAM:
CT MAXILLOFACIAL WITHOUT CONTRAST
TECHNIQUE: Multidetector CT imaging of the maxillofacial structures was
performed. Multiplanar CT image reconstructions were also generated.
A small metallic BB was placed on the right temple in order to
reliably differentiate right from left.

[Series 4: maxofacial soft · axial · 0.29mm/px · z∈[+1002,+1113]mm · 15 of 41 slices shown, 19 images]
[im 2/41  brain]
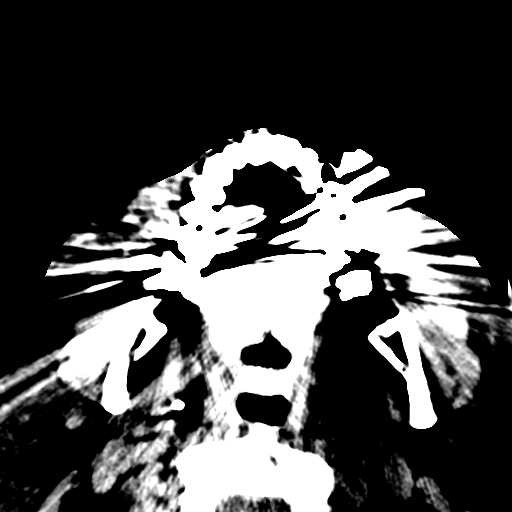
[im 2/41  bone]
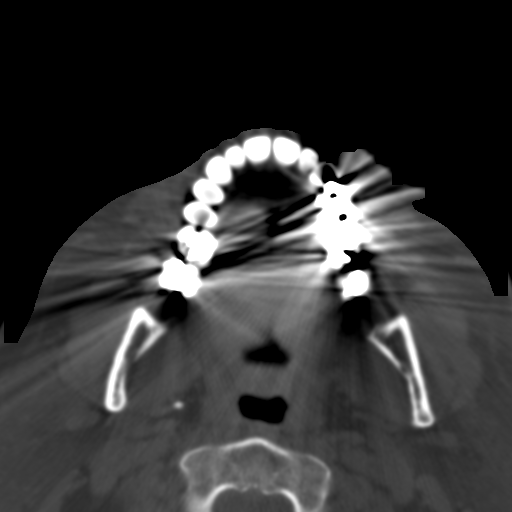
[im 5/41  bone]
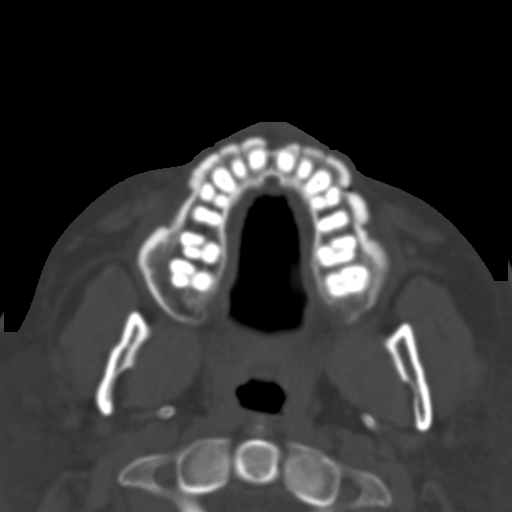
[im 7/41  bone]
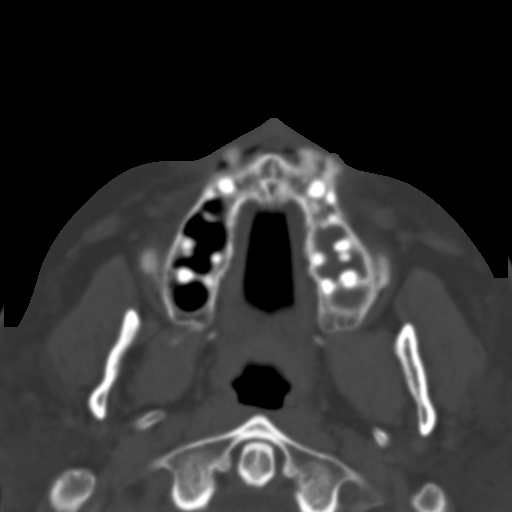
[im 10/41  bone]
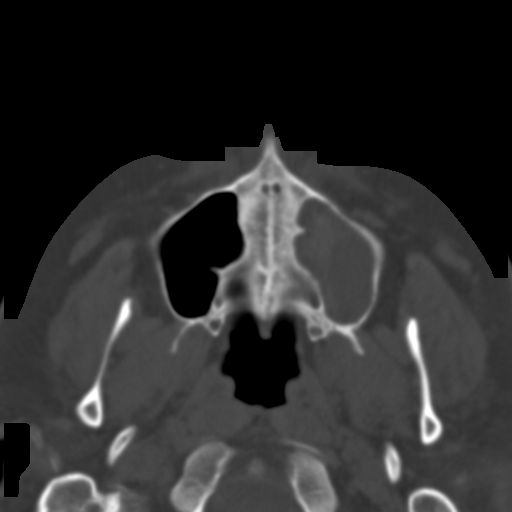
[im 13/41  brain]
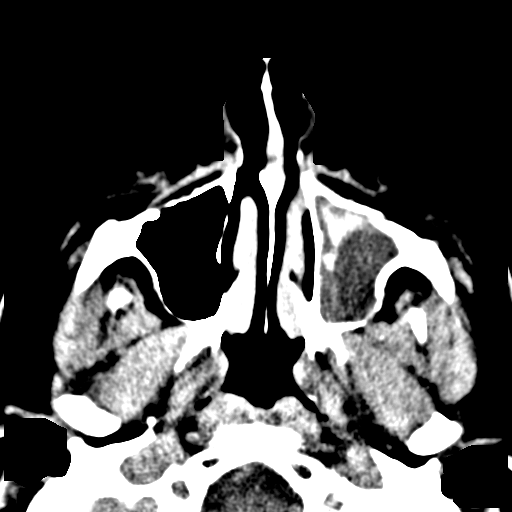
[im 13/41  bone]
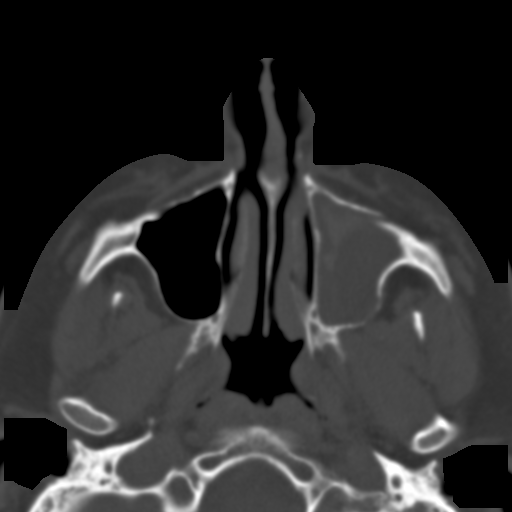
[im 16/41  bone]
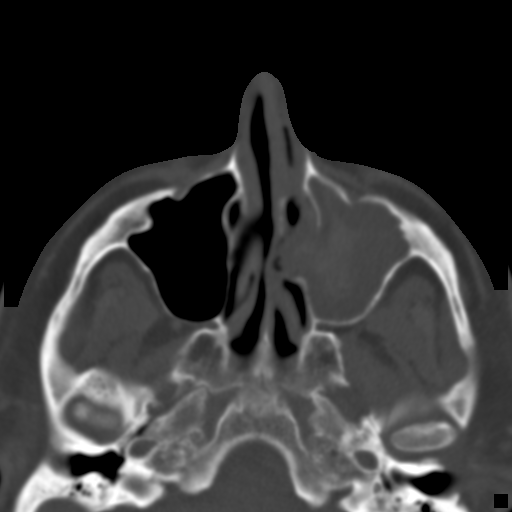
[im 18/41  bone]
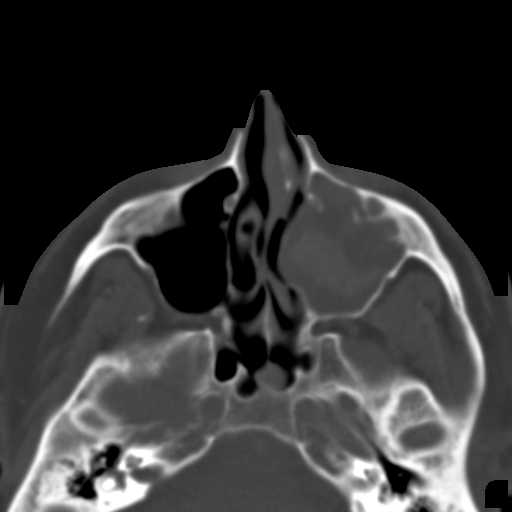
[im 21/41  bone]
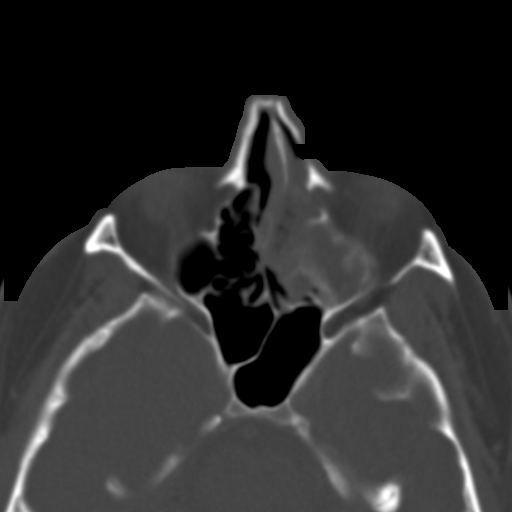
[im 23/41  brain]
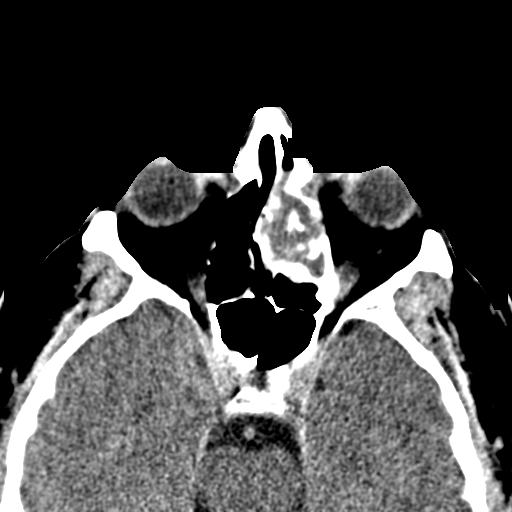
[im 23/41  bone]
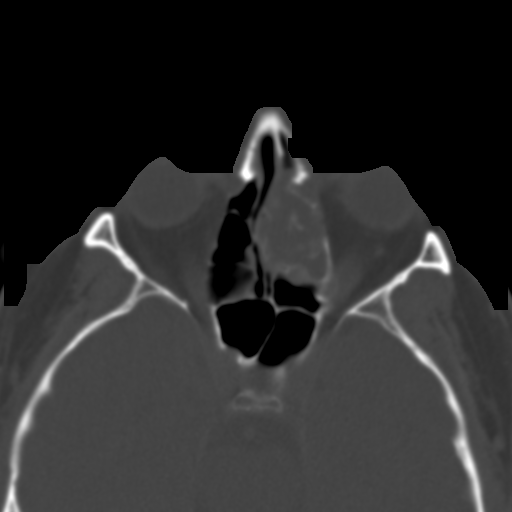
[im 25/41  bone]
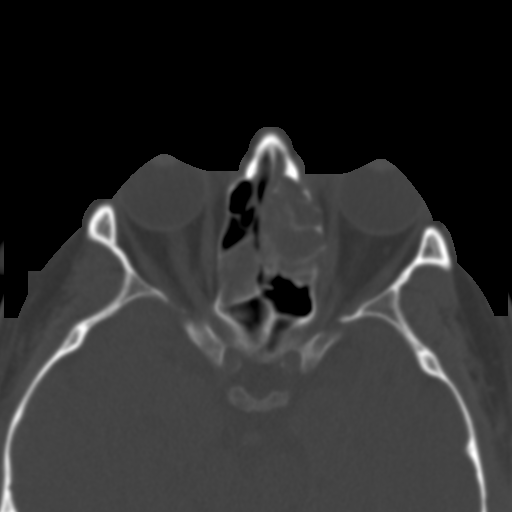
[im 28/41  bone]
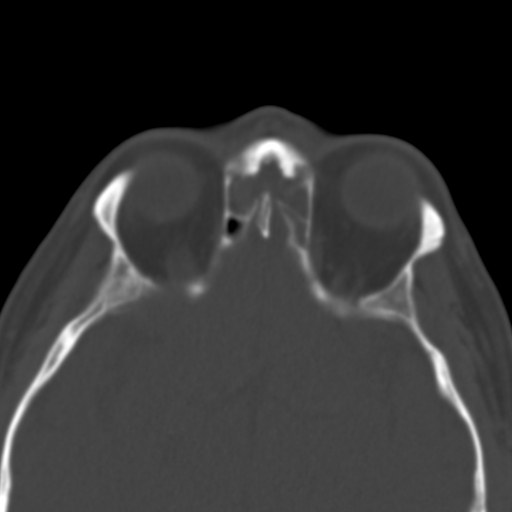
[im 31/41  bone]
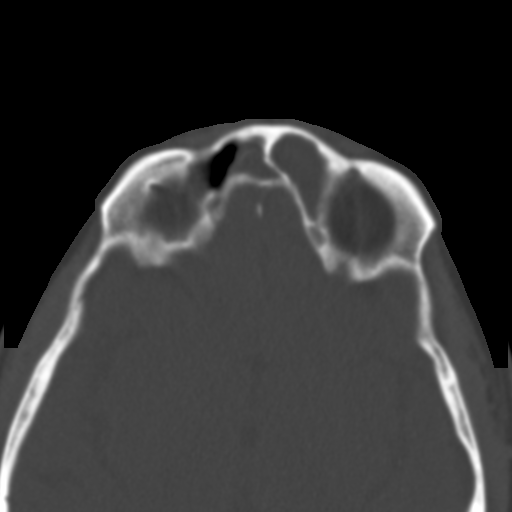
[im 34/41  brain]
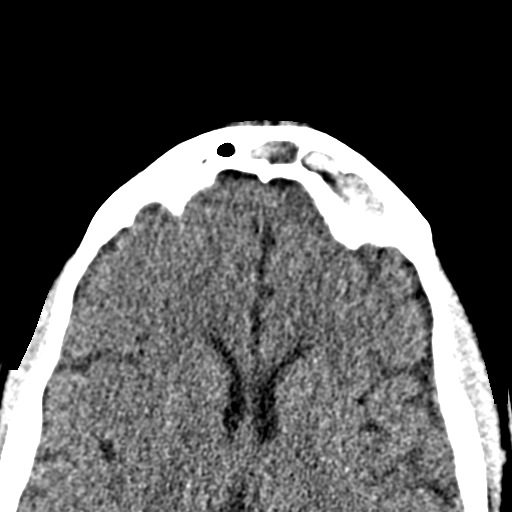
[im 34/41  bone]
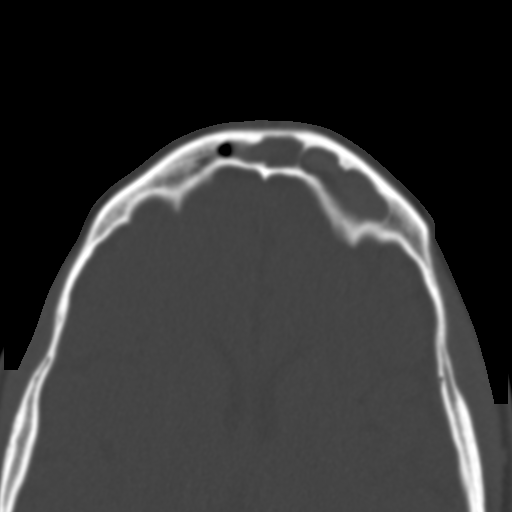
[im 36/41  bone]
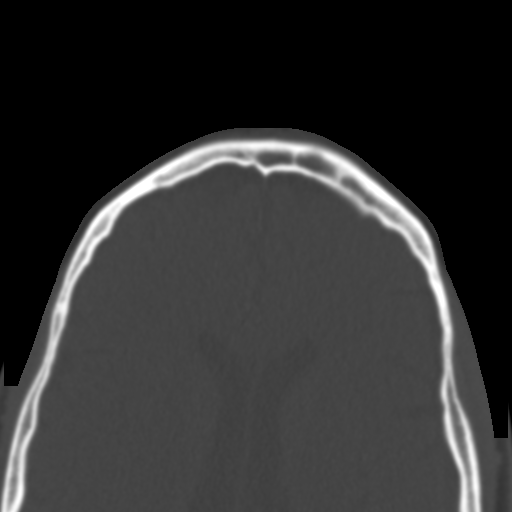
[im 39/41  bone]
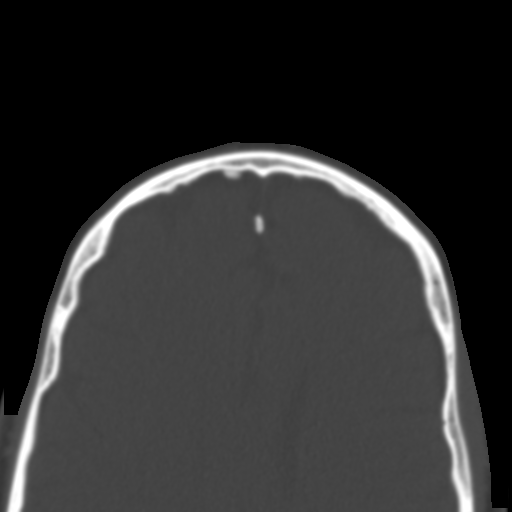

[15 of 30 positions shown; findings below may reference images not displayed]

FINDINGS: The left maxillary sinus is completely opacified and expanded with
its medial wall projecting into the nasal cavity and nearly
contacting the nasal septum. There are areas of osseous thinning
involving the posterior wall of the sinus without frank dehiscence.
Areas of increased density centrally within the sinus may reflect
allergic fungal sinusitis or inspissated secretions.

The left ostiomeatal complex is occluded. The ethmoid air cells are
completely opacified anteriorly and are expanded with scattered
areas of hyperdense material. The laminae papyracea bulges laterally
into the orbit with minimal deviation of the medial rectus muscle.
The left frontal sinus is similarly completely opacified with
central hyperattenuating material.

There is somewhat polypoid mucosal thickening in the medial right
frontal sinus which extends into the frontal recess and may also
communicate with the left frontal sinus inferiorly across the
midline. The left frontal recess is occluded.

There is posterior right ethmoid air cell opacification. A small
polyp or mucous retention cyst is noted in the left sphenoid sinus.
The right sphenoid and right maxillary sinuses are clear. The right
ostiomeatal complex is patent.

There is a small right middle turbinate concha bullosa. There is
S-shaped curvature of the nasal septum with leftward deviation
anteroinferiorly and rightward deviation more superiorly and
posteriorly. No sinus air-fluid levels are present. The no
inflammatory changes are identified in the orbits or premaxillary or
retroantral fat. The visualized portion of the brain is
unremarkable.
IMPRESSION: Completely opacified and expanded left maxillary, left ethmoid, and
left frontal sinuses with hyperdense material which may reflect
allergic fungal sinusitis.

## 2021-06-04 DIAGNOSIS — I10 Essential (primary) hypertension: Secondary | ICD-10-CM | POA: Diagnosis not present

## 2021-08-09 DIAGNOSIS — K644 Residual hemorrhoidal skin tags: Secondary | ICD-10-CM | POA: Diagnosis not present

## 2021-12-08 DIAGNOSIS — Z125 Encounter for screening for malignant neoplasm of prostate: Secondary | ICD-10-CM | POA: Diagnosis not present

## 2021-12-08 DIAGNOSIS — Z23 Encounter for immunization: Secondary | ICD-10-CM | POA: Diagnosis not present

## 2021-12-08 DIAGNOSIS — N529 Male erectile dysfunction, unspecified: Secondary | ICD-10-CM | POA: Diagnosis not present

## 2021-12-08 DIAGNOSIS — I10 Essential (primary) hypertension: Secondary | ICD-10-CM | POA: Diagnosis not present

## 2021-12-08 DIAGNOSIS — Z0000 Encounter for general adult medical examination without abnormal findings: Secondary | ICD-10-CM | POA: Diagnosis not present

## 2021-12-08 DIAGNOSIS — E781 Pure hyperglyceridemia: Secondary | ICD-10-CM | POA: Diagnosis not present

## 2022-06-20 DIAGNOSIS — N529 Male erectile dysfunction, unspecified: Secondary | ICD-10-CM | POA: Diagnosis not present

## 2022-06-20 DIAGNOSIS — E781 Pure hyperglyceridemia: Secondary | ICD-10-CM | POA: Diagnosis not present

## 2022-06-20 DIAGNOSIS — I10 Essential (primary) hypertension: Secondary | ICD-10-CM | POA: Diagnosis not present

## 2022-06-20 DIAGNOSIS — R0683 Snoring: Secondary | ICD-10-CM | POA: Diagnosis not present

## 2022-12-21 DIAGNOSIS — N529 Male erectile dysfunction, unspecified: Secondary | ICD-10-CM | POA: Diagnosis not present

## 2022-12-21 DIAGNOSIS — Z0000 Encounter for general adult medical examination without abnormal findings: Secondary | ICD-10-CM | POA: Diagnosis not present

## 2022-12-21 DIAGNOSIS — E781 Pure hyperglyceridemia: Secondary | ICD-10-CM | POA: Diagnosis not present

## 2022-12-21 DIAGNOSIS — I10 Essential (primary) hypertension: Secondary | ICD-10-CM | POA: Diagnosis not present

## 2022-12-21 DIAGNOSIS — Z23 Encounter for immunization: Secondary | ICD-10-CM | POA: Diagnosis not present

## 2023-02-16 DIAGNOSIS — J029 Acute pharyngitis, unspecified: Secondary | ICD-10-CM | POA: Diagnosis not present

## 2023-06-23 DIAGNOSIS — E669 Obesity, unspecified: Secondary | ICD-10-CM | POA: Diagnosis not present

## 2023-06-23 DIAGNOSIS — E781 Pure hyperglyceridemia: Secondary | ICD-10-CM | POA: Diagnosis not present

## 2023-06-23 DIAGNOSIS — N1831 Chronic kidney disease, stage 3a: Secondary | ICD-10-CM | POA: Diagnosis not present

## 2023-06-23 DIAGNOSIS — N529 Male erectile dysfunction, unspecified: Secondary | ICD-10-CM | POA: Diagnosis not present

## 2023-06-23 DIAGNOSIS — I10 Essential (primary) hypertension: Secondary | ICD-10-CM | POA: Diagnosis not present

## 2024-01-01 DIAGNOSIS — Z6836 Body mass index (BMI) 36.0-36.9, adult: Secondary | ICD-10-CM | POA: Diagnosis not present

## 2024-01-01 DIAGNOSIS — Z23 Encounter for immunization: Secondary | ICD-10-CM | POA: Diagnosis not present

## 2024-01-01 DIAGNOSIS — N1831 Chronic kidney disease, stage 3a: Secondary | ICD-10-CM | POA: Diagnosis not present

## 2024-01-01 DIAGNOSIS — Z0000 Encounter for general adult medical examination without abnormal findings: Secondary | ICD-10-CM | POA: Diagnosis not present

## 2024-01-01 DIAGNOSIS — I10 Essential (primary) hypertension: Secondary | ICD-10-CM | POA: Diagnosis not present

## 2024-01-01 DIAGNOSIS — N529 Male erectile dysfunction, unspecified: Secondary | ICD-10-CM | POA: Diagnosis not present

## 2024-01-01 DIAGNOSIS — E781 Pure hyperglyceridemia: Secondary | ICD-10-CM | POA: Diagnosis not present

## 2024-01-01 DIAGNOSIS — E669 Obesity, unspecified: Secondary | ICD-10-CM | POA: Diagnosis not present
